# Patient Record
Sex: Female | Born: 1990 | Race: Black or African American | Hispanic: No | Marital: Single | Smoking: Never smoker
Health system: Southern US, Community
[De-identification: ages and names within clinical notes are randomized; demographics above are authoritative.]

---

## 2013-10-14 ENCOUNTER — Encounter: Payer: Managed Care, Other (non HMO) | Admitting: Internal Medicine

## 2013-10-15 ENCOUNTER — Ambulatory Visit: Payer: Self-pay | Admitting: Family Medicine

## 2013-10-15 VITALS — BP 122/64 | HR 91 | Temp 98.1°F | Resp 16 | Ht 70.0 in | Wt 222.4 lb

## 2013-10-15 DIAGNOSIS — Z021 Encounter for pre-employment examination: Secondary | ICD-10-CM

## 2013-10-15 DIAGNOSIS — Z0489 Encounter for examination and observation for other specified reasons: Secondary | ICD-10-CM

## 2013-10-15 NOTE — Progress Notes (Signed)
Subjective: Patient is here for a drug screen for a new job. she apparently has to pay for this herself. She last smoked some marijuana a year ago.  Objective: Healthy-appearing young lady. No problems. She has a little concerned about her weight and we discussed that.  Assessment: Drug screen in a nonuser  Plan:

## 2013-10-15 NOTE — Patient Instructions (Signed)
We are happy to see if other medical issues arise.  The official drug screen that needs the legal criteria is set off, but we are are doing one here also for immediate use.

## 2013-10-30 ENCOUNTER — Ambulatory Visit: Payer: Self-pay | Admitting: Physician Assistant

## 2013-10-30 VITALS — BP 122/80 | HR 90 | Temp 98.0°F | Resp 16 | Ht 70.0 in | Wt 223.0 lb

## 2013-10-30 DIAGNOSIS — Z0289 Encounter for other administrative examinations: Secondary | ICD-10-CM

## 2013-10-30 DIAGNOSIS — Z021 Encounter for pre-employment examination: Secondary | ICD-10-CM

## 2013-10-30 NOTE — Patient Instructions (Signed)
We will call you with the test results  Please let us know if we can do anything else to help

## 2013-10-30 NOTE — Progress Notes (Signed)
   Subjective:    Patient ID: Betsey AmenJamica Kittell, female    DOB: [birth year], 23 y.o.   MRN: [id number]  HPI   Ms. 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  She had a drug screen done about 2 wks ago, but it was only 10 panel, so she needs to repeat.  She denies substance use.  She otherwise feels well today.     Review of Systems  Constitutional: Negative.   Respiratory: Negative.   Cardiovascular: Negative.   Gastrointestinal: Negative.   Musculoskeletal: Negative.   Skin: Negative.        Objective:   Physical Exam  Vitals reviewed. Constitutional: She is oriented to person, place, and time. She appears well-developed and well-nourished. No distress.  HENT:  Head: Normocephalic and atraumatic.  Eyes: Conjunctivae are normal. No scleral icterus.  Cardiovascular: Normal rate, regular rhythm and normal heart sounds.   Pulmonary/Chest: Effort normal and breath sounds normal. She has no wheezes. She has no rales.  Neurological: She is alert and oriented to person, place, and time.  Skin: Skin is warm and dry.  Psychiatric: She has a normal mood and affect. Her behavior is normal.        Assessment & Plan:  Drug screening, pre-employment   Ms. Beverely PaceBryant is a pleasant 23 yr old female here for 12 panel drug screen.  Specimen collected, will notify pt of results when available  E. Frances FurbishElizabeth Russell Quinney MHS, PA-C [facility] 1/29/20158:39 PM

## 2017-11-05 ENCOUNTER — Other Ambulatory Visit: Payer: Self-pay | Admitting: Family Medicine

## 2017-11-05 DIAGNOSIS — E049 Nontoxic goiter, unspecified: Secondary | ICD-10-CM

## 2017-11-21 ENCOUNTER — Ambulatory Visit
Admission: RE | Admit: 2017-11-21 | Discharge: 2017-11-21 | Disposition: A | Discharge status: Home / Self Care | Payer: No Typology Code available for payment source | Source: Ambulatory Visit | Attending: Family Medicine | Admitting: Family Medicine

## 2017-11-21 DIAGNOSIS — E049 Nontoxic goiter, unspecified: Secondary | ICD-10-CM

## 2020-02-15 IMAGING — US US THYROID
1 series · 14 of 25 positions shown · non-contrast
Comparison: None.

CLINICAL DATA: Thyroid enlargement by physical examination.

EXAM:
THYROID ULTRASOUND
TECHNIQUE: Ultrasound examination of the thyroid gland and adjacent soft
tissues was performed.

[Series 1: us thyroid · 0.06mm/px · 14 of 37 slices shown]
[im 1/37]
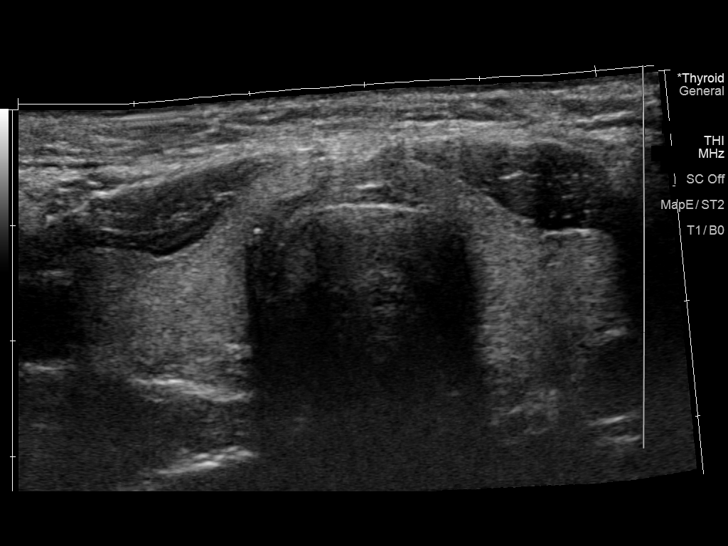
[im 4/37]
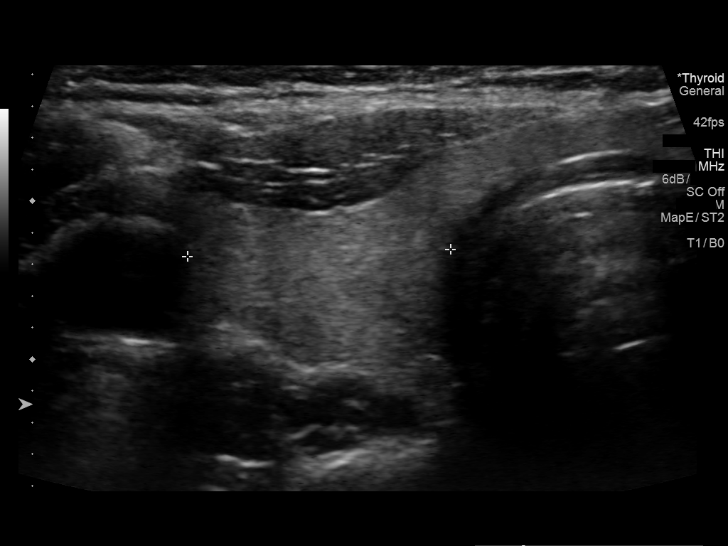
[im 7/37]
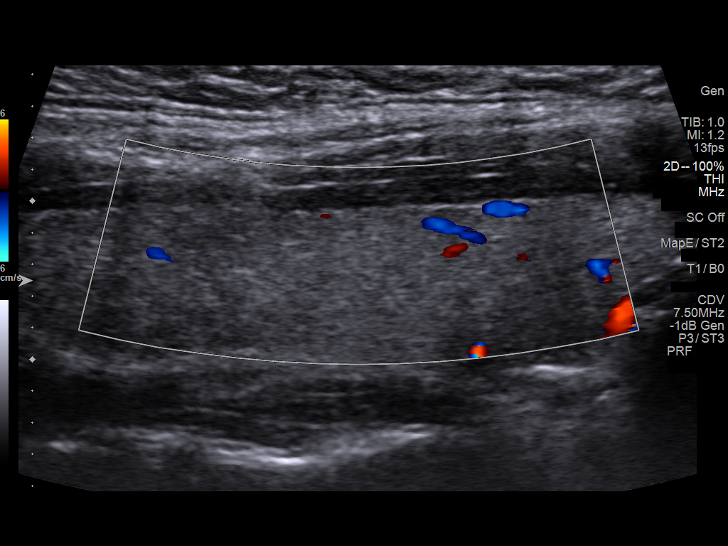
[im 10/37]
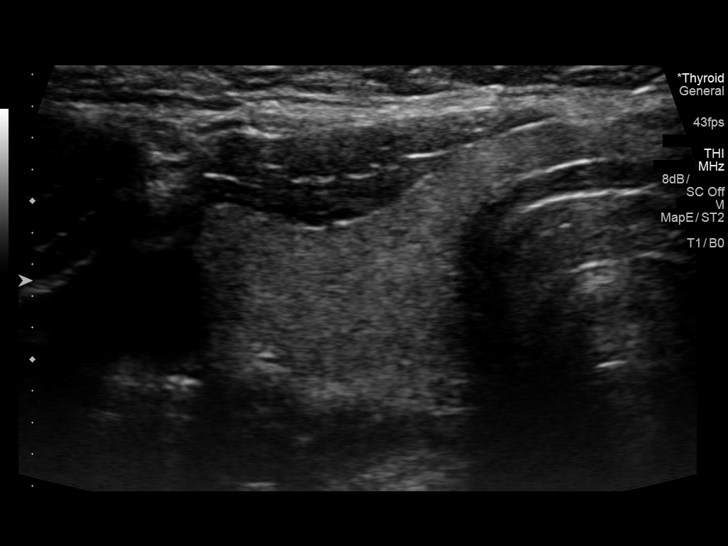
[im 13/37]
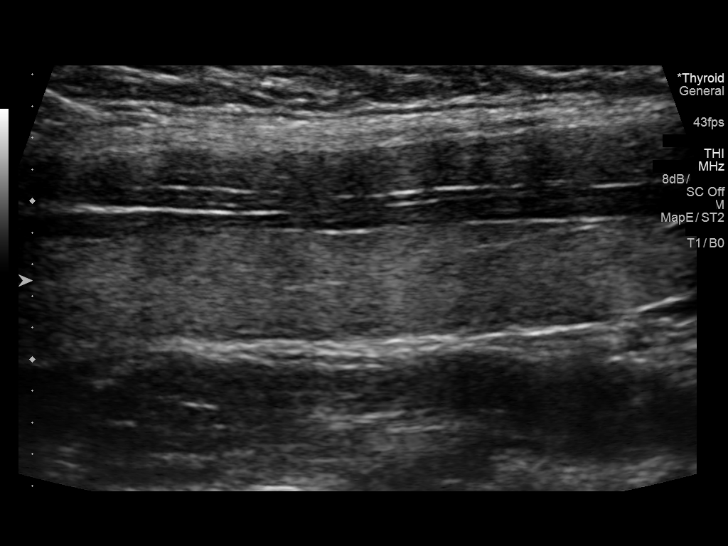
[im 14/37]
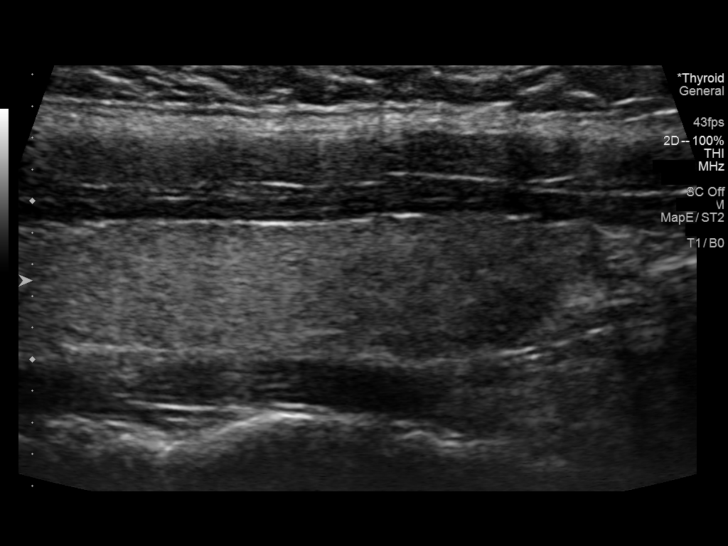
[im 17/37]
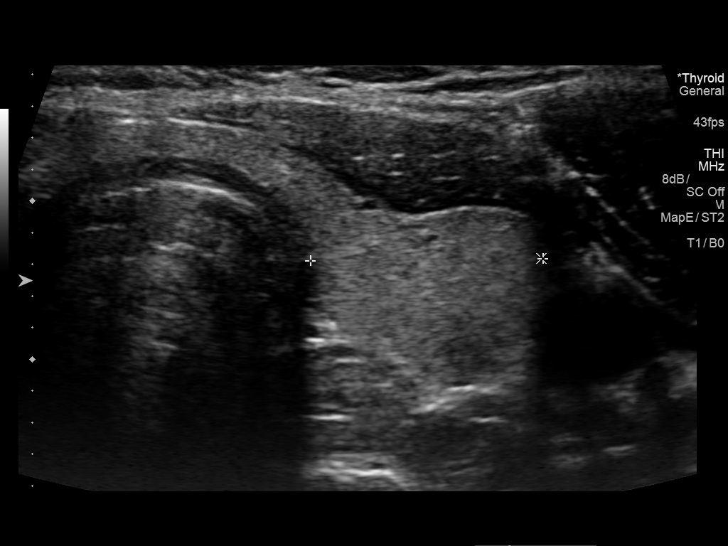
[im 20/37]
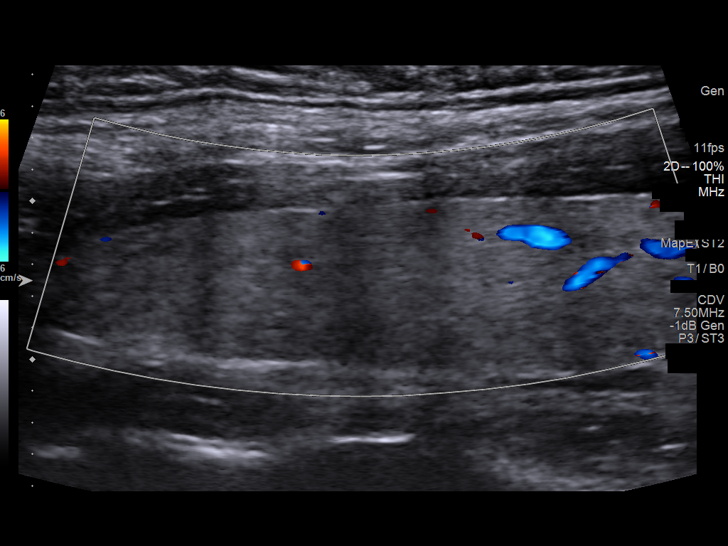
[im 23/37]
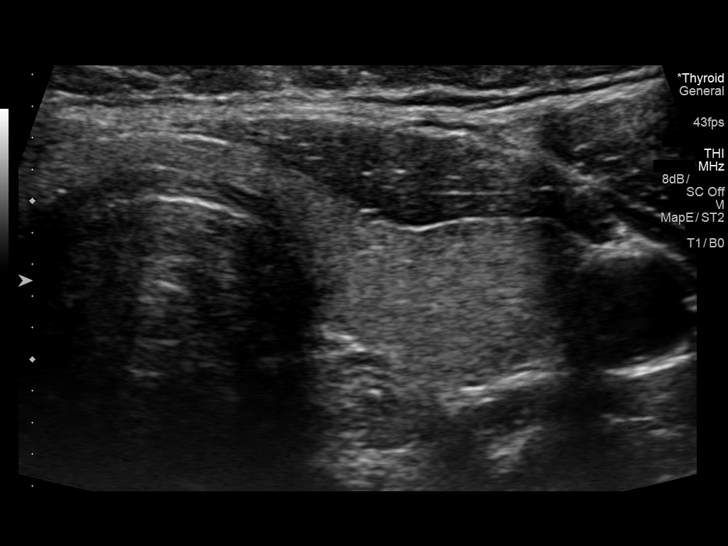
[im 25/37]
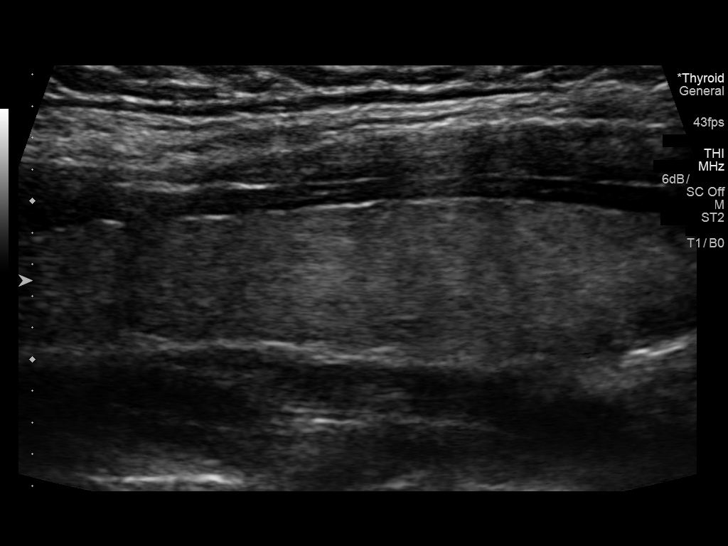
[im 28/37]
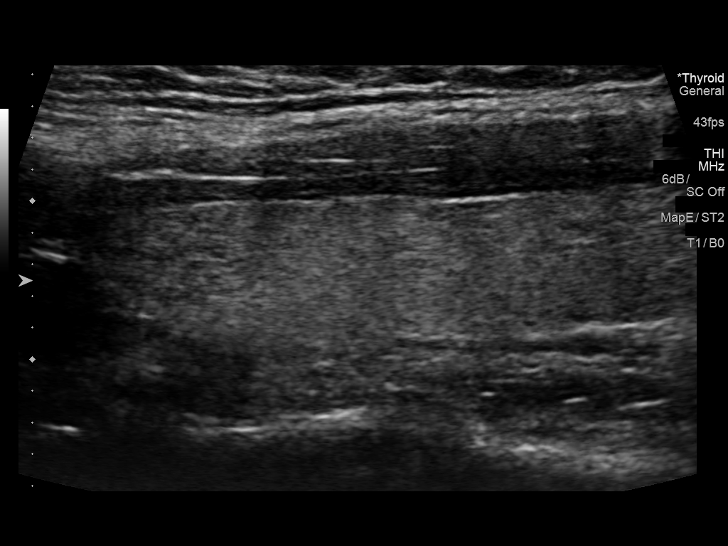
[im 31/37]
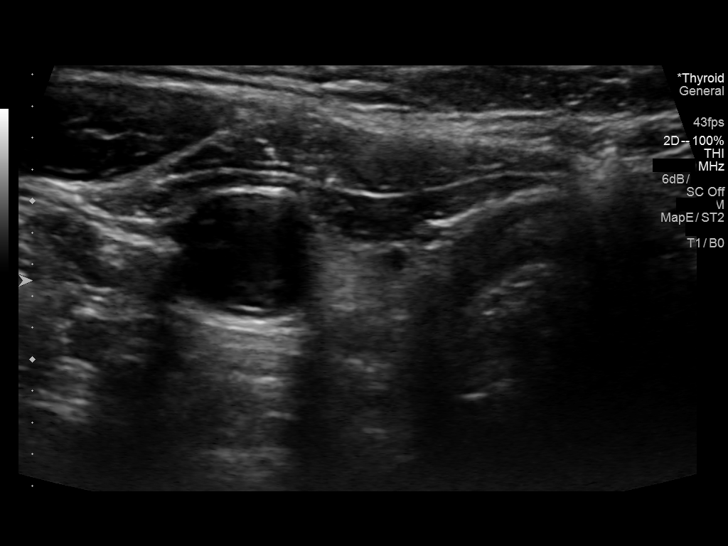
[im 34/37]
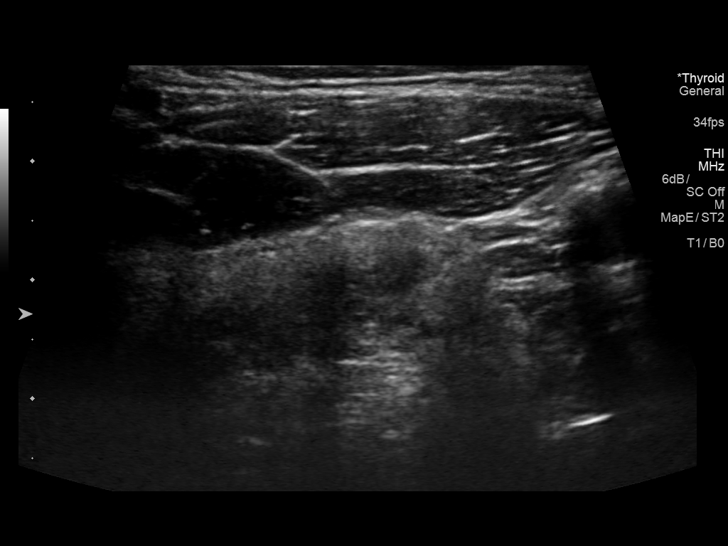
[im 37/37]
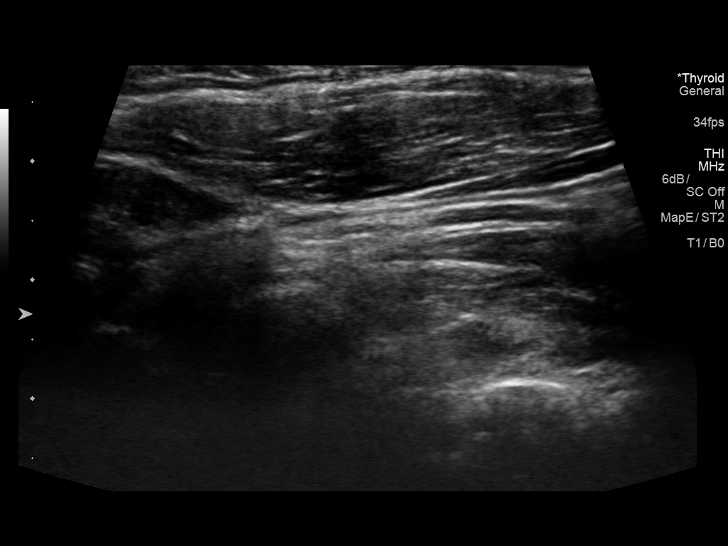

[14 of 25 positions shown; findings below may reference images not displayed]

FINDINGS: Parenchymal Echotexture: Normal

Isthmus: 0.3 cm

Right lobe: 5.0 x 1.0 x 1.7 cm

Left lobe: 5.3 x 1.1 x 1.5 cm

_________________________________________________________

Estimated total number of nodules >/= 1 cm: 0

Number of spongiform nodules >/=  2 cm not described below (TR1): 0

Number of mixed cystic and solid nodules >/= 1.5 cm not described
below (TR2): 0

_________________________________________________________

No discrete nodules are seen within the thyroid gland.

No lymph node enlargement around the thyroid.
IMPRESSION: Normal thyroid ultrasound.
